# Patient Record
Sex: Male | Born: 1985 | Race: White | Hispanic: No | Marital: Single | Smoking: Current every day smoker
Health system: Southern US, Community
[De-identification: ages and names within clinical notes are randomized; demographics above are authoritative.]

## PROBLEM LIST (undated history)

## (undated) DIAGNOSIS — M5430 Sciatica, unspecified side: Secondary | ICD-10-CM

---
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
## 2008-02-10 ENCOUNTER — Emergency Department: Payer: Self-pay | Admitting: Emergency Medicine

## 2009-10-13 ENCOUNTER — Emergency Department: Payer: Self-pay | Admitting: Internal Medicine

## 2010-08-25 ENCOUNTER — Emergency Department: Payer: Self-pay | Admitting: Unknown Physician Specialty

## 2011-03-28 ENCOUNTER — Emergency Department: Payer: Self-pay | Admitting: Emergency Medicine

## 2011-07-07 ENCOUNTER — Emergency Department: Payer: Self-pay | Admitting: Emergency Medicine

## 2011-07-07 LAB — URINALYSIS, COMPLETE
Blood: NEGATIVE
Ketone: NEGATIVE
Nitrite: NEGATIVE
Ph: 7 (ref 4.5–8.0)
Protein: NEGATIVE
RBC,UR: NONE SEEN /HPF (ref 0–5)

## 2011-07-07 LAB — DRUG SCREEN, URINE
Amphetamines, Ur Screen: NEGATIVE (ref ?–1000)
Barbiturates, Ur Screen: NEGATIVE (ref ?–200)
Benzodiazepine, Ur Scrn: NEGATIVE (ref ?–200)
Cannabinoid 50 Ng, Ur ~~LOC~~: POSITIVE (ref ?–50)
Cocaine Metabolite,Ur ~~LOC~~: NEGATIVE (ref ?–300)
Opiate, Ur Screen: NEGATIVE (ref ?–300)
Phencyclidine (PCP) Ur S: NEGATIVE (ref ?–25)

## 2011-07-07 LAB — COMPREHENSIVE METABOLIC PANEL
Alkaline Phosphatase: 72 U/L (ref 50–136)
BUN: 10 mg/dL (ref 7–18)
Bilirubin,Total: 0.5 mg/dL (ref 0.2–1.0)
Co2: 28 mmol/L (ref 21–32)
Creatinine: 0.81 mg/dL (ref 0.60–1.30)
EGFR (African American): >60
Glucose: 93 mg/dL (ref 65–99)
Osmolality: 284 (ref 275–301)
SGPT (ALT): 27 U/L
Sodium: 143 mmol/L (ref 136–145)
Total Protein: 7.9 g/dL (ref 6.4–8.2)

## 2011-07-07 LAB — CBC
HCT: 47 % (ref 40.0–52.0)
MCV: 93 fL (ref 80–100)
RBC: 5.07 10*6/uL (ref 4.40–5.90)
WBC: 8.4 10*3/uL (ref 3.8–10.6)

## 2012-04-30 ENCOUNTER — Emergency Department: Payer: Self-pay | Admitting: Internal Medicine

## 2013-02-05 ENCOUNTER — Emergency Department: Payer: Self-pay | Admitting: Emergency Medicine

## 2013-02-05 LAB — CBC
HCT: 46.5 % (ref 40.0–52.0)
HGB: 16 g/dL (ref 13.0–18.0)
MCH: 31.4 pg (ref 26.0–34.0)
MCHC: 34.4 g/dL (ref 32.0–36.0)
MCV: 91 fL (ref 80–100)
RBC: 5.1 10*6/uL (ref 4.40–5.90)

## 2013-02-06 LAB — DRUG SCREEN, URINE
Amphetamines, Ur Screen: NEGATIVE (ref ?–1000)
Barbiturates, Ur Screen: NEGATIVE (ref ?–200)
Cannabinoid 50 Ng, Ur ~~LOC~~: POSITIVE (ref ?–50)
Cocaine Metabolite,Ur ~~LOC~~: NEGATIVE (ref ?–300)
Methadone, Ur Screen: NEGATIVE (ref ?–300)
Opiate, Ur Screen: NEGATIVE (ref ?–300)

## 2013-02-06 LAB — COMPREHENSIVE METABOLIC PANEL
Alkaline Phosphatase: 97 U/L (ref 50–136)
Anion Gap: 5 — ABNORMAL LOW (ref 7–16)
BUN: 7 mg/dL (ref 7–18)
Calcium, Total: 8.8 mg/dL (ref 8.5–10.1)
Chloride: 103 mmol/L (ref 98–107)
Co2: 29 mmol/L (ref 21–32)
Creatinine: 0.82 mg/dL (ref 0.60–1.30)
EGFR (African American): >60
EGFR (Non-African Amer.): >60
Osmolality: 272 (ref 275–301)
SGOT(AST): 20 U/L (ref 15–37)
SGPT (ALT): 29 U/L (ref 12–78)
Sodium: 137 mmol/L (ref 136–145)
Total Protein: 8 g/dL (ref 6.4–8.2)

## 2013-02-06 LAB — URINALYSIS, COMPLETE
Bilirubin,UR: NEGATIVE
Nitrite: NEGATIVE
Specific Gravity: 1.004 (ref 1.003–1.030)
Squamous Epithelial: NONE SEEN
WBC UR: NONE SEEN /HPF (ref 0–5)

## 2013-02-06 LAB — TSH: Thyroid Stimulating Horm: 1.01 u[IU]/mL

## 2013-02-06 LAB — ETHANOL
Ethanol %: 0.127 % — ABNORMAL HIGH (ref 0.000–0.080)
Ethanol: 127 mg/dL

## 2013-02-12 ENCOUNTER — Emergency Department: Payer: Self-pay | Admitting: Emergency Medicine

## 2013-02-12 LAB — URINALYSIS, COMPLETE
Glucose,UR: NEGATIVE mg/dL (ref 0–75)
Ketone: NEGATIVE
Leukocyte Esterase: NEGATIVE
Ph: 6 (ref 4.5–8.0)
Protein: NEGATIVE
RBC,UR: <1 /HPF (ref 0–5)
Specific Gravity: 1.005 (ref 1.003–1.030)
Squamous Epithelial: <1

## 2013-02-12 LAB — COMPREHENSIVE METABOLIC PANEL
Albumin: 4.1 g/dL (ref 3.4–5.0)
Anion Gap: 8 (ref 7–16)
Bilirubin,Total: 0.3 mg/dL (ref 0.2–1.0)
Calcium, Total: 8.4 mg/dL — ABNORMAL LOW (ref 8.5–10.1)
Chloride: 111 mmol/L — ABNORMAL HIGH (ref 98–107)
Co2: 24 mmol/L (ref 21–32)
Glucose: 99 mg/dL (ref 65–99)
Osmolality: 283 (ref 275–301)
SGOT(AST): 43 U/L — ABNORMAL HIGH (ref 15–37)
Sodium: 143 mmol/L (ref 136–145)
Total Protein: 7.6 g/dL (ref 6.4–8.2)

## 2013-02-12 LAB — CBC
MCHC: 34.8 g/dL (ref 32.0–36.0)
RDW: 12.8 % (ref 11.5–14.5)
WBC: 12.4 10*3/uL — ABNORMAL HIGH (ref 3.8–10.6)

## 2013-02-12 LAB — DRUG SCREEN, URINE
Amphetamines, Ur Screen: NEGATIVE (ref ?–1000)
Barbiturates, Ur Screen: NEGATIVE (ref ?–200)
Benzodiazepine, Ur Scrn: NEGATIVE (ref ?–200)
Cannabinoid 50 Ng, Ur ~~LOC~~: POSITIVE (ref ?–50)
Cocaine Metabolite,Ur ~~LOC~~: NEGATIVE (ref ?–300)
Phencyclidine (PCP) Ur S: NEGATIVE (ref ?–25)
Tricyclic, Ur Screen: NEGATIVE (ref ?–1000)

## 2013-02-12 LAB — ETHANOL
Ethanol %: 0.211 % — ABNORMAL HIGH (ref 0.000–0.080)
Ethanol: 211 mg/dL
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
## 2014-10-09 ENCOUNTER — Emergency Department: Admit: 2014-10-09 | Discharge status: Against Medical Advice | Payer: Self-pay | Admitting: Emergency Medicine

## 2017-11-13 ENCOUNTER — Emergency Department
Admission: EM | Admit: 2017-11-13 | Discharge: 2017-11-13 | Disposition: A | Discharge status: Home / Self Care | Payer: Self-pay | Attending: Emergency Medicine | Admitting: Emergency Medicine

## 2017-11-13 ENCOUNTER — Other Ambulatory Visit: Payer: Self-pay

## 2017-11-13 DIAGNOSIS — Y939 Activity, unspecified: Secondary | ICD-10-CM | POA: Insufficient documentation

## 2017-11-13 DIAGNOSIS — K047 Periapical abscess without sinus: Secondary | ICD-10-CM | POA: Insufficient documentation

## 2017-11-13 DIAGNOSIS — K0889 Other specified disorders of teeth and supporting structures: Secondary | ICD-10-CM

## 2017-11-13 DIAGNOSIS — S025XXB Fracture of tooth (traumatic), initial encounter for open fracture: Secondary | ICD-10-CM

## 2017-11-13 DIAGNOSIS — Y929 Unspecified place or not applicable: Secondary | ICD-10-CM | POA: Insufficient documentation

## 2017-11-13 DIAGNOSIS — Y998 Other external cause status: Secondary | ICD-10-CM | POA: Insufficient documentation

## 2017-11-13 DIAGNOSIS — F17200 Nicotine dependence, unspecified, uncomplicated: Secondary | ICD-10-CM | POA: Insufficient documentation

## 2017-11-13 DIAGNOSIS — Y33XXXA Other specified events, undetermined intent, initial encounter: Secondary | ICD-10-CM | POA: Insufficient documentation

## 2017-11-13 DIAGNOSIS — S025XXA Fracture of tooth (traumatic), initial encounter for closed fracture: Secondary | ICD-10-CM | POA: Insufficient documentation

## 2017-11-13 MED ORDER — AMOXICILLIN 500 MG PO CAPS: 500.0000 mg | ORAL_CAPSULE | Freq: Three times a day (TID) | ORAL | 0 refills | Status: DC

## 2017-11-13 MED ORDER — IBUPROFEN 800 MG PO TABS: 800.0000 mg | ORAL_TABLET | Freq: Three times a day (TID) | ORAL | 0 refills | Status: DC | PRN

## 2017-11-13 NOTE — ED Provider Notes (Signed)
[facility] Emergency Department Provider Note ____________________________________________  Time seen: 1145  I have reviewed the triage vital signs and the nursing notes.  HISTORY  Chief Complaint  Dental Pain   HPI Jeffery Morrow is a 32 y.o. male to the clinic today with left lower tooth pain and swelling.  He reports this started 2 to 3 days ago.  He reports a known broken tooth in that area.  He denies fever but has had chills.  He has rinsed his mouth with some salt water and took 2 doses of leftover amoxicillin with minimal relief.  He is not able to afford to see a dentist.  History reviewed. No pertinent past medical history.  There are no active problems to display for this patient.   History reviewed. No pertinent surgical history.  Prior to Admission medications   Medication Sig Start Date End Date Taking? Authorizing Provider  amoxicillin (AMOXIL) 500 MG capsule Take 1 capsule (500 mg total) by mouth 3 (three) times daily. 11/13/17   Lorre MunroeBaity, Cassady Stanczak W, NP  ibuprofen (ADVIL,MOTRIN) 800 MG tablet Take 1 tablet (800 mg total) by mouth every 8 (eight) hours as needed. 11/13/17   Lorre MunroeBaity, Dove Gresham W, NP    Allergies Patient has no known allergies.  No family history on file.  Social History Social History   Tobacco Use  . Smoking status: Current Every Day Smoker  . Smokeless tobacco: Never Used  Substance Use Topics  . Alcohol use: Yes  . Drug use: Not Currently    Review of Systems  Constitutional: Negative for fever. ENT: Positive for tooth pain and gum swelling.. Cardiovascular: Negative for chest pain. Respiratory: Negative for shortness of breath. ____________________________________________  PHYSICAL EXAM:  VITAL SIGNS: ED Triage Vitals  Enc Vitals Group     BP 11/13/17 1057 114/70     Pulse Rate 11/13/17 1057 75     Resp --      Temp 11/13/17 1057 98.4 F (36.9 C)     Temp Source 11/13/17 1057 Oral     SpO2 11/13/17 1057 100  %     Weight 11/13/17 1057 160 lb (72.6 kg)     Height 11/13/17 1057 5\' 11"  (1.803 m)     Head Circumference --      Peak Flow --      Pain Score 11/13/17 1113 7     Pain Loc --      Pain Edu? --      Excl. in GC? --     Constitutional: Alert and oriented. Well appearing and in no distress. Mouth/Throat: Mucous membranes are pink and moist.  Broken tooth noted of the left lower jaw.  Associated gum swelling noted. Hematological/Lymphatic/Immunological: No cervical lymphadenopathy. Cardiovascular: Normal rate, regular rhythm.  Respiratory: Normal respiratory effort. No wheezes/rales/rhonchi.  INITIAL IMPRESSION / ASSESSMENT AND PLAN / ED COURSE  Dental Pain secondary to Broken Tooth and Abscess:  Rx provided for Amoxicillin 500 mg 3 times daily x10 days Rx for ibuprofen 800 mg 3 times a day as needed for pain and inflammation Encouraged salt water gargles Advised him to [facility]    FINAL CLINICAL IMPRESSION(S) / ED DIAGNOSES  Final diagnoses:  Pain, dental  Open fracture of tooth, initial encounter  Dental abscess      Lorre MunroeBaity, Mckenzie Toruno W, NP 11/13/17 1211    Dionne BucySiadecki, Sebastian, MD 11/13/17 1338

## 2017-11-13 NOTE — Discharge Instructions (Signed)
We are treating you for a dental abscess.  You have been provided a prescription for amoxicillin and ibuprofen.  Continue salt water gargles.  [facility].

## 2017-11-13 NOTE — ED Triage Notes (Signed)
Pt c/o left lower tooth ache for the past 2-3 days with some mild swelling

## 2018-05-28 ENCOUNTER — Emergency Department
Admission: EM | Admit: 2018-05-28 | Discharge: 2018-05-28 | Disposition: A | Discharge status: Home / Self Care | Payer: Self-pay | Attending: Emergency Medicine | Admitting: Emergency Medicine

## 2018-05-28 ENCOUNTER — Other Ambulatory Visit: Payer: Self-pay

## 2018-05-28 DIAGNOSIS — K047 Periapical abscess without sinus: Secondary | ICD-10-CM | POA: Insufficient documentation

## 2018-05-28 DIAGNOSIS — F419 Anxiety disorder, unspecified: Secondary | ICD-10-CM | POA: Insufficient documentation

## 2018-05-28 DIAGNOSIS — F17210 Nicotine dependence, cigarettes, uncomplicated: Secondary | ICD-10-CM | POA: Insufficient documentation

## 2018-05-28 DIAGNOSIS — Z79899 Other long term (current) drug therapy: Secondary | ICD-10-CM | POA: Insufficient documentation

## 2018-05-28 MED ORDER — HYDROXYZINE HCL 10 MG PO TABS: 10.0000 mg | ORAL_TABLET | Freq: Three times a day (TID) | ORAL | 0 refills | Status: DC | PRN

## 2018-05-28 MED ORDER — MAGIC MOUTHWASH W/LIDOCAINE: 5.0000 mL | Freq: Four times a day (QID) | ORAL | 0 refills | Status: DC

## 2018-05-28 MED ORDER — CLINDAMYCIN HCL 300 MG PO CAPS: 300.0000 mg | ORAL_CAPSULE | Freq: Four times a day (QID) | ORAL | 0 refills | Status: DC

## 2018-05-28 MED ORDER — CHLORHEXIDINE GLUCONATE 0.12 % MT SOLN: 10.0000 mL | Freq: Two times a day (BID) | OROMUCOSAL | 0 refills | Status: DC
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
## 2018-05-28 NOTE — ED Notes (Signed)
See triage note. Pt states he has recently "taken left over antibiotics for a tooth ache but it didn't take the pain away." Pt c/o s/s of panic attack. Pt currently calm and cooperative.

## 2018-05-28 NOTE — ED Triage Notes (Signed)
Pt c/o "anxiety attacks", headache, neck pain, decreased appetite

## 2019-08-06 ENCOUNTER — Emergency Department
Admission: EM | Admit: 2019-08-06 | Discharge: 2019-08-06 | Disposition: A | Discharge status: Home / Self Care | Payer: Self-pay | Attending: Emergency Medicine | Admitting: Emergency Medicine

## 2019-08-06 ENCOUNTER — Encounter: Payer: Self-pay | Admitting: Emergency Medicine

## 2019-08-06 ENCOUNTER — Other Ambulatory Visit: Payer: Self-pay

## 2019-08-06 DIAGNOSIS — F17210 Nicotine dependence, cigarettes, uncomplicated: Secondary | ICD-10-CM | POA: Insufficient documentation

## 2019-08-06 DIAGNOSIS — K047 Periapical abscess without sinus: Secondary | ICD-10-CM | POA: Insufficient documentation

## 2019-08-06 MED ORDER — TRAMADOL HCL 50 MG PO TABS: 50.0000 mg | ORAL_TABLET | Freq: Four times a day (QID) | ORAL | 0 refills | Status: DC | PRN

## 2019-08-06 MED ORDER — CHLORHEXIDINE GLUCONATE 0.12 % MT SOLN: 10.0000 mL | Freq: Two times a day (BID) | OROMUCOSAL | 0 refills | Status: DC

## 2019-08-06 MED ORDER — AMOXICILLIN 500 MG PO CAPS: 500.0000 mg | ORAL_CAPSULE | Freq: Three times a day (TID) | ORAL | 0 refills | Status: DC

## 2019-08-06 NOTE — ED Triage Notes (Signed)
Pt reports toothache to left lower jaw for last week and a half.

## 2019-08-06 NOTE — ED Provider Notes (Signed)
[facility] Emergency Department Provider Note ____________________________________________  Time seen: Approximately 5:19 PM  I have reviewed the triage vital signs and the nursing notes.   HISTORY  Chief Complaint Dental Pain   HPI Jeffery Morrow is a 34 y.o. male presenting to the emergency department for treatment and evaluation of dental pain.  Patient states pain started about 10 days ago.  He has had problems with this tooth in the past but has not been able to afford to have it removed.  He states that several years ago he had a root canal on the same tooth but has lost the Somewhere along the way.  Pain has become intense and he is now noticing some facial swelling on the left lower jaw area.  No fever or other concerns.   History reviewed. No pertinent past medical history.  There are no problems to display for this patient.   History reviewed. No pertinent surgical history.  Prior to Admission medications   Medication Sig Start Date End Date Taking? Authorizing Provider  amoxicillin (AMOXIL) 500 MG capsule Take 1 capsule (500 mg total) by mouth 3 (three) times daily. 08/06/19   Shamir Sedlar, Rulon Eisenmenger B, FNP  chlorhexidine (PERIDEX) 0.12 % solution Use as directed 10 mLs in the mouth or throat 2 (two) times daily. Swish and spit 08/06/19   Quamesha Mullet B, FNP  hydrOXYzine (ATARAX/VISTARIL) 10 MG tablet Take 1 tablet (10 mg total) by mouth 3 (three) times daily as needed for anxiety. 05/28/18   Cuthriell, Delorise Royals, PA-C  ibuprofen (ADVIL,MOTRIN) 800 MG tablet Take 1 tablet (800 mg total) by mouth every 8 (eight) hours as needed. 11/13/17   Lorre Munroe, NP  magic mouthwash w/lidocaine SOLN Take 5 mLs by mouth 4 (four) times daily. Swish and spit 05/28/18   Cuthriell, Delorise Royals, PA-C  traMADol (ULTRAM) 50 MG tablet Take 1 tablet (50 mg total) by mouth every 6 (six) hours as needed. 08/06/19   Chinita Pester, FNP    Allergies Patient has no known  allergies.  No family history on file.  Social History Social History   Tobacco Use  . Smoking status: Current Every Day Smoker    Packs/day: 1.00    Types: Cigarettes  . Smokeless tobacco: Never Used  Substance Use Topics  . Alcohol use: Yes    Comment: socially  . Drug use: Not Currently    Review of Systems Constitutional: Negative for fever or recent illness. ENT: Positive for dental pain. Musculoskeletal: Negative for trismus of the jaw.  Skin: Negative for wound or lesion. ____________________________________________   PHYSICAL EXAM:  VITAL SIGNS: ED Triage Vitals  Enc Vitals Group     BP 08/06/19 1239 136/86     Pulse Rate 08/06/19 1239 90     Resp 08/06/19 1239 18     Temp 08/06/19 1239 99 F (37.2 C)     Temp Source 08/06/19 1239 Oral     SpO2 08/06/19 1239 100 %     Weight 08/06/19 1227 180 lb (81.6 kg)     Height 08/06/19 1227 5\' 11"  (1.803 m)     Head Circumference --      Peak Flow --      Pain Score 08/06/19 1227 8     Pain Loc --      Pain Edu? --      Excl. in GC? --     Constitutional: Alert and oriented. Well appearing and in no acute distress. Eyes:  Conjunctiva are clear without discharge or drainage. Mouth/Throat: airway patent. No edema of the tongue Periodontal Exam    Hematological/Lymphatic/Immunilogical: No palpable adenopathy Respiratory: Respirations even and unlabored. Musculoskeletal: Full ROM of the jaw. Neurologic: Awake, alert, oriented.  Skin:  Mild swelling over the left lower jaw that does not extend into the sublingual surface or submental area. Psychiatric: Affect and behavior intact.  ____________________________________________   LABS (all labs ordered are listed, but only abnormal results are displayed)  Labs Reviewed - No data to display ____________________________________________   RADIOLOGY  Not indicated. ____________________________________________   PROCEDURES  Procedure(s) performed:    Procedures  Critical Care performed: No ____________________________________________   INITIAL IMPRESSION / ASSESSMENT AND PLAN / ED COURSE  Jeffery Morrow is a 34 y.o. male presenting to the emergency department for treatment and evaluation of dental pain. See HPI for details. He will be treated with amoxicillin, tramadol, and peridex. He is to see the dentist within the next 2 weeks if possible. He is to return to the ER for symptoms of concern if unable to schedule an appointment.  Pertinent labs & imaging results that were available during my care of the patient were reviewed by me and considered in my medical decision making (see chart for details).  ____________________________________________   FINAL CLINICAL IMPRESSION(S) / ED DIAGNOSES  Final diagnoses:  Dental abscess    Discharge Medication List as of 08/06/2019  1:01 PM    START taking these medications   Details  traMADol (ULTRAM) 50 MG tablet Take 1 tablet (50 mg total) by mouth every 6 (six) hours as needed., Starting Mon 08/06/2019, Normal        If controlled substance prescribed during this visit, 12 month history viewed on the [place] prior to issuing an initial prescription for Schedule II or III opiod.  Note:  This document was prepared using Dragon voice recognition software and may include unintentional dictation errors.   Victorino Dike, FNP 08/06/19 1741    Duffy Bruce, MD 08/07/19 1429

## 2019-08-06 NOTE — ED Notes (Signed)
See triage note. Pt in with severe pain to tooth on lower left side. Abscess present. Provider Triplett currently educating and discussing plan of care with patient.

## 2020-03-04 ENCOUNTER — Other Ambulatory Visit: Payer: Self-pay

## 2021-06-19 ENCOUNTER — Emergency Department: Payer: Self-pay

## 2021-06-19 ENCOUNTER — Other Ambulatory Visit: Payer: Self-pay

## 2021-06-19 ENCOUNTER — Encounter: Payer: Self-pay | Admitting: Emergency Medicine

## 2021-06-19 DIAGNOSIS — S61402A Unspecified open wound of left hand, initial encounter: Secondary | ICD-10-CM | POA: Insufficient documentation

## 2021-06-19 DIAGNOSIS — Y990 Civilian activity done for income or pay: Secondary | ICD-10-CM | POA: Insufficient documentation

## 2021-06-19 DIAGNOSIS — X58XXXA Exposure to other specified factors, initial encounter: Secondary | ICD-10-CM | POA: Insufficient documentation

## 2021-06-19 DIAGNOSIS — Z23 Encounter for immunization: Secondary | ICD-10-CM | POA: Insufficient documentation

## 2021-06-19 LAB — CBC WITH DIFFERENTIAL/PLATELET
Abs Immature Granulocytes: 0.03 10*3/uL (ref 0.00–0.07)
Basophils Absolute: 0 10*3/uL (ref 0.0–0.1)
Basophils Relative: 0 %
Eosinophils Absolute: 0.3 10*3/uL (ref 0.0–0.5)
Eosinophils Relative: 3 %
HCT: 47.7 % (ref 39.0–52.0)
Hemoglobin: 16.3 g/dL (ref 13.0–17.0)
Immature Granulocytes: 0 %
Lymphocytes Relative: 31 %
Lymphs Abs: 2.9 10*3/uL (ref 0.7–4.0)
MCH: 32.6 pg (ref 26.0–34.0)
MCHC: 34.2 g/dL (ref 30.0–36.0)
MCV: 95.4 fL (ref 80.0–100.0)
Monocytes Absolute: 1 10*3/uL (ref 0.1–1.0)
Monocytes Relative: 10 %
Neutro Abs: 5.1 10*3/uL (ref 1.7–7.7)
Neutrophils Relative %: 56 %
Platelets: 246 10*3/uL (ref 150–400)
RBC: 5 MIL/uL (ref 4.22–5.81)
RDW: 13 % (ref 11.5–15.5)
WBC: 9.3 10*3/uL (ref 4.0–10.5)
nRBC: 0 % (ref 0.0–0.2)

## 2021-06-19 LAB — COMPREHENSIVE METABOLIC PANEL
ALT: 40 U/L (ref 0–44)
AST: 38 U/L (ref 15–41)
Albumin: 3.8 g/dL (ref 3.5–5.0)
Alkaline Phosphatase: 93 U/L (ref 38–126)
Anion gap: 7 (ref 5–15)
BUN: 7 mg/dL (ref 6–20)
CO2: 28 mmol/L (ref 22–32)
Calcium: 9 mg/dL (ref 8.9–10.3)
Chloride: 103 mmol/L (ref 98–111)
Creatinine, Ser: 1.02 mg/dL (ref 0.61–1.24)
GFR, Estimated: >60 mL/min (ref >60)
Glucose, Bld: 101 mg/dL — ABNORMAL HIGH (ref 70–99)
Potassium: 3.4 mmol/L — ABNORMAL LOW (ref 3.5–5.1)
Sodium: 138 mmol/L (ref 135–145)
Total Bilirubin: 0.4 mg/dL (ref 0.3–1.2)
Total Protein: 7.3 g/dL (ref 6.5–8.1)

## 2021-06-19 LAB — LACTIC ACID, PLASMA: Lactic Acid, Venous: 1.4 mmol/L (ref 0.5–1.9)

## 2021-06-19 NOTE — ED Notes (Addendum)
Tubes sent to lab: blood culture x 2, 1 red/blue/light green/green/lav, 1 lactic on ice

## 2021-06-19 NOTE — ED Triage Notes (Signed)
Pt c/o left hand pain after getting a wound on his left middle finger Monday or Tuesday. Pt has red streaking up left hand and hand is swollen.

## 2021-06-20 ENCOUNTER — Emergency Department
Admission: EM | Admit: 2021-06-20 | Discharge: 2021-06-20 | Disposition: A | Discharge status: Home / Self Care | Payer: Self-pay | Attending: Emergency Medicine | Admitting: Emergency Medicine

## 2021-06-20 DIAGNOSIS — S61402A Unspecified open wound of left hand, initial encounter: Secondary | ICD-10-CM

## 2021-06-20 MED ORDER — PROBIOTIC 1-250 BILLION-MG PO CAPS: 1.0000 | ORAL_CAPSULE | Freq: Two times a day (BID) | ORAL | 0 refills | Status: DC

## 2021-06-20 MED ORDER — CEPHALEXIN 500 MG PO CAPS: 500.0000 mg | ORAL_CAPSULE | Freq: Four times a day (QID) | ORAL | 0 refills | Status: AC

## 2021-06-20 MED ORDER — SULFAMETHOXAZOLE-TRIMETHOPRIM 800-160 MG PO TABS: 1.0000 | ORAL_TABLET | Freq: Two times a day (BID) | ORAL | 0 refills | Status: DC

## 2021-06-20 MED ORDER — CEPHALEXIN 500 MG PO CAPS
500.0000 mg | ORAL_CAPSULE | Freq: Once | ORAL | Status: AC
  Administered 2021-06-20: 500 mg via ORAL
  Filled 2021-06-20: qty 1

## 2021-06-20 MED ORDER — TETANUS-DIPHTH-ACELL PERTUSSIS 5-2.5-18.5 LF-MCG/0.5 IM SUSY
0.5000 mL | PREFILLED_SYRINGE | Freq: Once | INTRAMUSCULAR | Status: AC
  Administered 2021-06-20: 0.5 mL via INTRAMUSCULAR
  Filled 2021-06-20: qty 0.5

## 2021-06-20 MED ORDER — SULFAMETHOXAZOLE-TRIMETHOPRIM 800-160 MG PO TABS
1.0000 | ORAL_TABLET | Freq: Once | ORAL | Status: AC
  Administered 2021-06-20: 1 via ORAL
  Filled 2021-06-20: qty 1

## 2021-06-20 NOTE — ED Provider Notes (Signed)
[facility] Provider Note    Event Date/Time   First MD Initiated Contact with Patient 06/20/21 0421     (approximate)   History   Cellulitis   HPI  Jeffery Morrow is a 36 y.o. 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  He states that the area has become slightly more swollen and red.  He has not had any drainage.  No fevers.  He is not immunocompromised or diabetic.  No pain, tenderness over the flexor tendon and no difficulty flexing this finger.  No numbness or weakness.  Unsure of his last tetanus vaccine.   History provided by patient and significant other at bedside.      History reviewed. No pertinent past medical history.  History reviewed. No pertinent surgical history.  MEDICATIONS:  Prior to Admission medications   Medication Sig Start Date End Date Taking? Authorizing Provider  amoxicillin (AMOXIL) 500 MG capsule Take 1 capsule (500 mg total) by mouth 3 (three) times daily. 08/06/19   Triplett, Rulon Eisenmengerari B, FNP  chlorhexidine (PERIDEX) 0.12 % solution Use as directed 10 mLs in the mouth or throat 2 (two) times daily. Swish and spit 08/06/19   Triplett, Cari B, FNP  hydrOXYzine (ATARAX/VISTARIL) 10 MG tablet Take 1 tablet (10 mg total) by mouth 3 (three) times daily as needed for anxiety. 05/28/18   Cuthriell, Delorise RoyalsJonathan D, PA-C  ibuprofen (ADVIL,MOTRIN) 800 MG tablet Take 1 tablet (800 mg total) by mouth every 8 (eight) hours as needed. 11/13/17   Lorre MunroeBaity, Regina W, NP  magic mouthwash w/lidocaine SOLN Take 5 mLs by mouth 4 (four) times daily. Swish and spit 05/28/18   Cuthriell, Delorise RoyalsJonathan D, PA-C  traMADol (ULTRAM) 50 MG tablet Take 1 tablet (50 mg total) by mouth every 6 (six) hours as needed. 08/06/19   Chinita Pesterriplett, Cari B, FNP    Physical Exam   Triage Vital  Signs: ED Triage Vitals [06/19/21 2024]  Enc Vitals Group     BP 124/75     Pulse Rate 73     Resp 18     Temp 98.3 F (36.8 C)     Temp src      SpO2 99 %     Weight 170 lb (77.1 kg)     Height 5\' 11"  (1.803 m)     Head Circumference      Peak Flow      Pain Score 6     Pain Loc      Pain Edu?      Excl. in GC?     Most recent vital signs: Vitals:   06/19/21 2024 06/20/21 0437  BP: 124/75 128/81  Pulse: 73 75  Resp: 18 20  Temp: 98.3 F (36.8 C) 98 F (36.7 C)  SpO2: 99% 100%    CONSTITUTIONAL: Alert and oriented and responds appropriately to questions. Well-appearing; well-nourished HEAD: Normocephalic, atraumatic EYES: Conjunctivae clear, pupils appear equal, sclera nonicteric ENT: normal nose; moist mucous membranes NECK: Supple, normal ROM CARD: RRR; S1 and S2 appreciated; no murmurs, no clicks, no rubs, no gallops RESP: Normal chest excursion without splinting or tachypnea; breath sounds clear and equal bilaterally; no wheezes, no rhonchi, no rales, no hypoxia or respiratory distress, speaking full sentences ABD/GI: Normal bowel sounds; non-distended; soft, non-tender, no rebound,  no guarding, no peritoneal signs BACK: The back appears normal EXT: Patient has a superficial 1 cm x 6 mm wound to the proximal radial aspect of the left third digit with small amount of surrounding redness and warmth that does extend to the dorsal hand but not up into the arm.  There is no fluctuance, drainage.  No induration.  There is no redness, warmth, tenderness over the flexor tendon of this finger.  He has full flexion and extension of the third digit.  Normal capillary refill.  2+ left radial pulse.  Compartments of the left arm are soft.  No bony deformity or bony injury.  No joint effusions noted. SKIN: Normal color for age and race; warm; no rash on exposed skin NEURO: Moves all extremities equally, normal speech PSYCH: The patient's mood and manner are  appropriate.     LEFT HAND  Patient gave verbal permission to utilize photo for medical documentation only. The image was not stored on any personal device.    ED Results / Procedures / Treatments   LABS: (all labs ordered are listed, but only abnormal results are displayed) Labs Reviewed  COMPREHENSIVE METABOLIC PANEL - Abnormal; Notable for the following components:      Result Value   Potassium 3.4 (*)    Glucose, Bld 101 (*)    All other components within normal limits  CULTURE, BLOOD (ROUTINE X 2)  CULTURE, BLOOD (ROUTINE X 2)  CBC WITH DIFFERENTIAL/PLATELET  LACTIC ACID, PLASMA  LACTIC ACID, PLASMA     EKG:     RADIOLOGY: My personal review and interpretation of imaging: X-ray of the left hand shows no fracture, foreign body, osteomyelitis.  I have personally reviewed all radiology reports.   DG Hand Complete Left  Result Date: 06/19/2021 CLINICAL DATA:  Middle left finger wound with worsening left hand swelling. EXAM: LEFT HAND - COMPLETE 3+ VIEW COMPARISON:  None. FINDINGS: There is no evidence of fracture or dislocation. There is no evidence of arthropathy or other focal bone abnormality. A superficial soft tissue defect is seen adjacent to the distal aspect of the proximal phalanx of the third left finger. IMPRESSION: Soft tissue defect involving the third left finger, without evidence of acute osseous abnormality. Electronically Signed   By: Aram Candela M.D.   On: 06/19/2021 20:55     PROCEDURES:  Critical Care performed: No     Procedures    IMPRESSION / MDM / ASSESSMENT AND PLAN / ED COURSE  I reviewed the triage vital signs and the nursing notes.    Patient here with a superficial wound to the left third digit with surrounding cellulitis.    DIFFERENTIAL DIAGNOSIS (includes but not limited to):   Cellulitis, abscess, less likely flexor tenosynovitis, osteomyelitis, bacteremia, sepsis   PLAN: Labs, x-ray, antibiotics, tetanus  vaccine.   MEDICATIONS GIVEN IN ED: Medications  sulfamethoxazole-trimethoprim (BACTRIM DS) 800-160 MG per tablet 1 tablet (1 tablet Oral Given 06/20/21 0445)  cephALEXin (KEFLEX) capsule 500 mg (500 mg Oral Given 06/20/21 0445)  Tdap (BOOSTRIX) injection 0.5 mL (0.5 mLs Intramuscular Given 06/20/21 0445)     ED COURSE: Patient's labs today were reviewed and were reassuring.  Normal white blood cell count.  Normal lactic.  X-ray reviewed by myself and radiologist shows no retained foreign body, fracture or signs of osteomyelitis.  He is well-appearing, nontoxic here and is not diabetic or immunocompromise.  On exam he has no sign of herpetic whitlow, felon, abscess, flexor tenosynovitis.  He is neurovascular  intact distally.  He has not yet been on any antibiotics for this.  We will clean the wound and bandage.  We will place him on Keflex and Bactrim for broad coverage.  Recommended probiotics while on these medications.  Recommended Tylenol, Motrin for pain control.  His tetanus vaccine has been updated today.  Discussed wound care instructions and return precautions with patient and girlfriend at bedside.   At this time, I do not feel there is any life-threatening condition present. I reviewed all nursing notes, vitals, pertinent previous records.  All labs, EKGs, imaging ordered have been independently reviewed and interpreted by myself.  I have reviewed nursing notes and appropriate previous records.  I feel the patient is safe to be discharged home without further emergent workup and can continue workup as an outpatient as needed. Discussed all findings and treatment plan with patient and girlfriend.  As well as usual and customary return precautions.  They verbalize understanding and are comfortable with this plan.  Outpatient follow-up has been provided as needed. All questions have been answered.    CONSULTS: Admission not required at this time given cellulitis is mild without systemic symptoms  with reassuring labs, vitals.  Does not need emergent hand surgery evaluation as he has no sign of flexor tenosynovitis, abscess on exam.   OUTSIDE RECORDS REVIEWED: Patient has multiple previous notes from [facility] emergency department but no other clinic notes in epic or care everywhere for review.         FINAL CLINICAL IMPRESSION(S) / ED DIAGNOSES   Final diagnoses:  Open wound of left hand without foreign body, unspecified wound type, initial encounter     Rx / DC Orders   ED Discharge Orders          Ordered    cephALEXin (KEFLEX) 500 MG capsule  4 times daily        06/20/21 0435    sulfamethoxazole-trimethoprim (BACTRIM DS) 800-160 MG tablet  2 times daily        06/20/21 0435    Bacillus Coagulans-Inulin (PROBIOTIC) 1-250 BILLION-MG CAPS  2 times daily        06/20/21 0435             Note:  This document was prepared using Dragon voice recognition software and may include unintentional dictation errors.   Pailyn Bellevue, Layla Maw, DO 06/20/21 [phone]

## 2021-06-20 NOTE — Discharge Instructions (Addendum)
You may alternate Tylenol 1000 mg every 6 hours as needed for pain, fever and Ibuprofen 800 mg every 8 hours as needed for pain, fever.  Please take Ibuprofen with food.  Do not take more than 4000 mg of Tylenol (acetaminophen) in a 24 hour period.   Steps to find a Primary Care Provider (PCP):  Call [phone] or [phone] to access "[place] Find a Doctor Service."  2.  You may also go on the [place] website at [email or web address]  

## 2021-06-24 LAB — CULTURE, BLOOD (ROUTINE X 2)
Culture: NO GROWTH
Culture: NO GROWTH
Special Requests: ADEQUATE
Special Requests: ADEQUATE

## 2022-01-09 ENCOUNTER — Emergency Department
Admission: EM | Admit: 2022-01-09 | Discharge: 2022-01-09 | Disposition: A | Discharge status: Home / Self Care | Payer: Self-pay | Attending: Student in an Organized Health Care Education/Training Program | Admitting: Student in an Organized Health Care Education/Training Program

## 2022-01-09 ENCOUNTER — Other Ambulatory Visit: Payer: Self-pay

## 2022-01-09 ENCOUNTER — Encounter: Payer: Self-pay | Admitting: Emergency Medicine

## 2022-01-09 DIAGNOSIS — K047 Periapical abscess without sinus: Secondary | ICD-10-CM | POA: Insufficient documentation

## 2022-01-09 DIAGNOSIS — K0381 Cracked tooth: Secondary | ICD-10-CM | POA: Insufficient documentation

## 2022-01-09 MED ORDER — AMOXICILLIN 500 MG PO CAPS: 500.0000 mg | ORAL_CAPSULE | Freq: Three times a day (TID) | ORAL | 0 refills | Status: DC

## 2022-01-09 MED ORDER — NAPROXEN 500 MG PO TABS: 500.0000 mg | ORAL_TABLET | Freq: Two times a day (BID) | ORAL | 0 refills | Status: DC

## 2022-01-09 MED ORDER — TRAMADOL HCL 50 MG PO TABS: 50.0000 mg | ORAL_TABLET | Freq: Four times a day (QID) | ORAL | 0 refills | Status: DC | PRN

## 2022-01-09 NOTE — ED Provider Notes (Signed)
[facility] Provider Note    Event Date/Time   First MD Initiated Contact with Patient 01/09/22 [phone]     (approximate)   History   Dental Pain   HPI  Jeffery Morrow is a 36 y.o. male  presents to the ER for treatment and evaluation of right side facial pain and swelling.  He has had a broken tooth for the past couple years that has been painful off and on but never to this point.  Pain started increasing yesterday and he awakened today with facial swelling.  No fever.   History reviewed. No pertinent past medical history.   Physical Exam   Triage Vital Signs: ED Triage Vitals  Enc Vitals Group     BP 01/09/22 0759 128/87     Pulse Rate 01/09/22 0759 62     Resp 01/09/22 0759 20     Temp 01/09/22 0759 97.9 F (36.6 C)     Temp Source 01/09/22 0759 Oral     SpO2 01/09/22 0759 99 %     Weight 01/09/22 0758 170 lb (77.1 kg)     Height 01/09/22 0758 6' (1.829 m)     Head Circumference --      Peak Flow --      Pain Score 01/09/22 0758 10     Pain Loc --      Pain Edu? --      Excl. in GC? --     Most recent vital signs: Vitals:   01/09/22 0759  BP: 128/87  Pulse: 62  Resp: 20  Temp: 97.9 F (36.6 C)  SpO2: 99%    General: Awake, no distress.  CV:  Good peripheral perfusion.  Resp:  Normal effort.  Abd:  No distention.  Other:  Right side facial swelling with associated chronic dental fracture. No obvious gingival fluctuance.  Gingival erythema and edema surrounding broken tooth.  Sublingual surface is soft.  No cervical lymphadenopathy   ED Results / Procedures / Treatments   Labs (all labs ordered are listed, but only abnormal results are displayed) Labs Reviewed - No data to display   EKG     RADIOLOGY    I have independently reviewed and interpreted imaging as well as reviewed report from radiology.  PROCEDURES:  Critical Care performed: No  Procedures   MEDICATIONS ORDERED IN ED:  Medications - No data  to display   IMPRESSION / MDM / ASSESSMENT AND PLAN / ED COURSE   I reviewed the triage vital signs and the nursing notes.  Differential diagnosis includes, but is not limited to: Dental pain, dental abscess, necrotizing gingivitis  Patient's presentation is most consistent with acute, uncomplicated illness.  36 year old male presenting to the emergency department for treatment and evaluation of dental pain with associated facial swelling.  See HPI for further details.  Patient will be placed on amoxicillin and given a short course of tramadol as he does appear that he is not significant pain.  He was encouraged to see a dentist within 2 weeks.  If symptoms change or worsen and he is unable to get an appointment, he is to return to the emergency department.     FINAL CLINICAL IMPRESSION(S) / ED DIAGNOSES   Final diagnoses:  Dental abscess     Rx / DC Orders   ED Discharge Orders          Ordered    amoxicillin (AMOXIL) 500 MG capsule  3 times daily  01/09/22 0814    traMADol (ULTRAM) 50 MG tablet  Every 6 hours PRN        01/09/22 0814    naproxen (NAPROSYN) 500 MG tablet  2 times daily with meals        01/09/22 0354             Note:  This document was prepared using Dragon voice recognition software and may include unintentional dictation errors.   Chinita Pester, FNP 01/09/22 [id number]    Willy Eddy, MD 01/09/22 [phone]

## 2022-01-09 NOTE — ED Notes (Signed)
Pt report broke a tooth to his right upper jaw 2 years ago and yesterday he started with pain to that tooth in his right upper jaw. Pt reports pain radiated all over the left side of his face. Pt reports does not have a regular dentist. Denies fevers

## 2022-01-09 NOTE — ED Notes (Signed)
Pt verbalizes understanding of d/c instructions, medications and follow up 

## 2022-01-09 NOTE — ED Triage Notes (Signed)
Pt via POV from home. Pt c/o R upper dental pain stated yesterday around 0400 AM states that the pain is causing some R sided facial swelling. Some swelling noticed. Pt states he has a broken tooth on that side. Pt is A&OX4 and NAD

## 2022-01-09 NOTE — Discharge Instructions (Signed)
Please call and schedule a dental appointment as soon as possible. You will need to be seen within the next 14 days. Return to the emergency department for symptoms that change or worsen if you're unable to schedule an appointment. ° °

## 2023-06-17 IMAGING — CR DG HAND COMPLETE 3+V*L*
3 series · 3 of 3 positions shown · non-contrast
Comparison: None.

CLINICAL DATA: Middle left finger wound with worsening left hand
swelling.

EXAM:
LEFT HAND - COMPLETE 3+ VIEW

[hand ap]
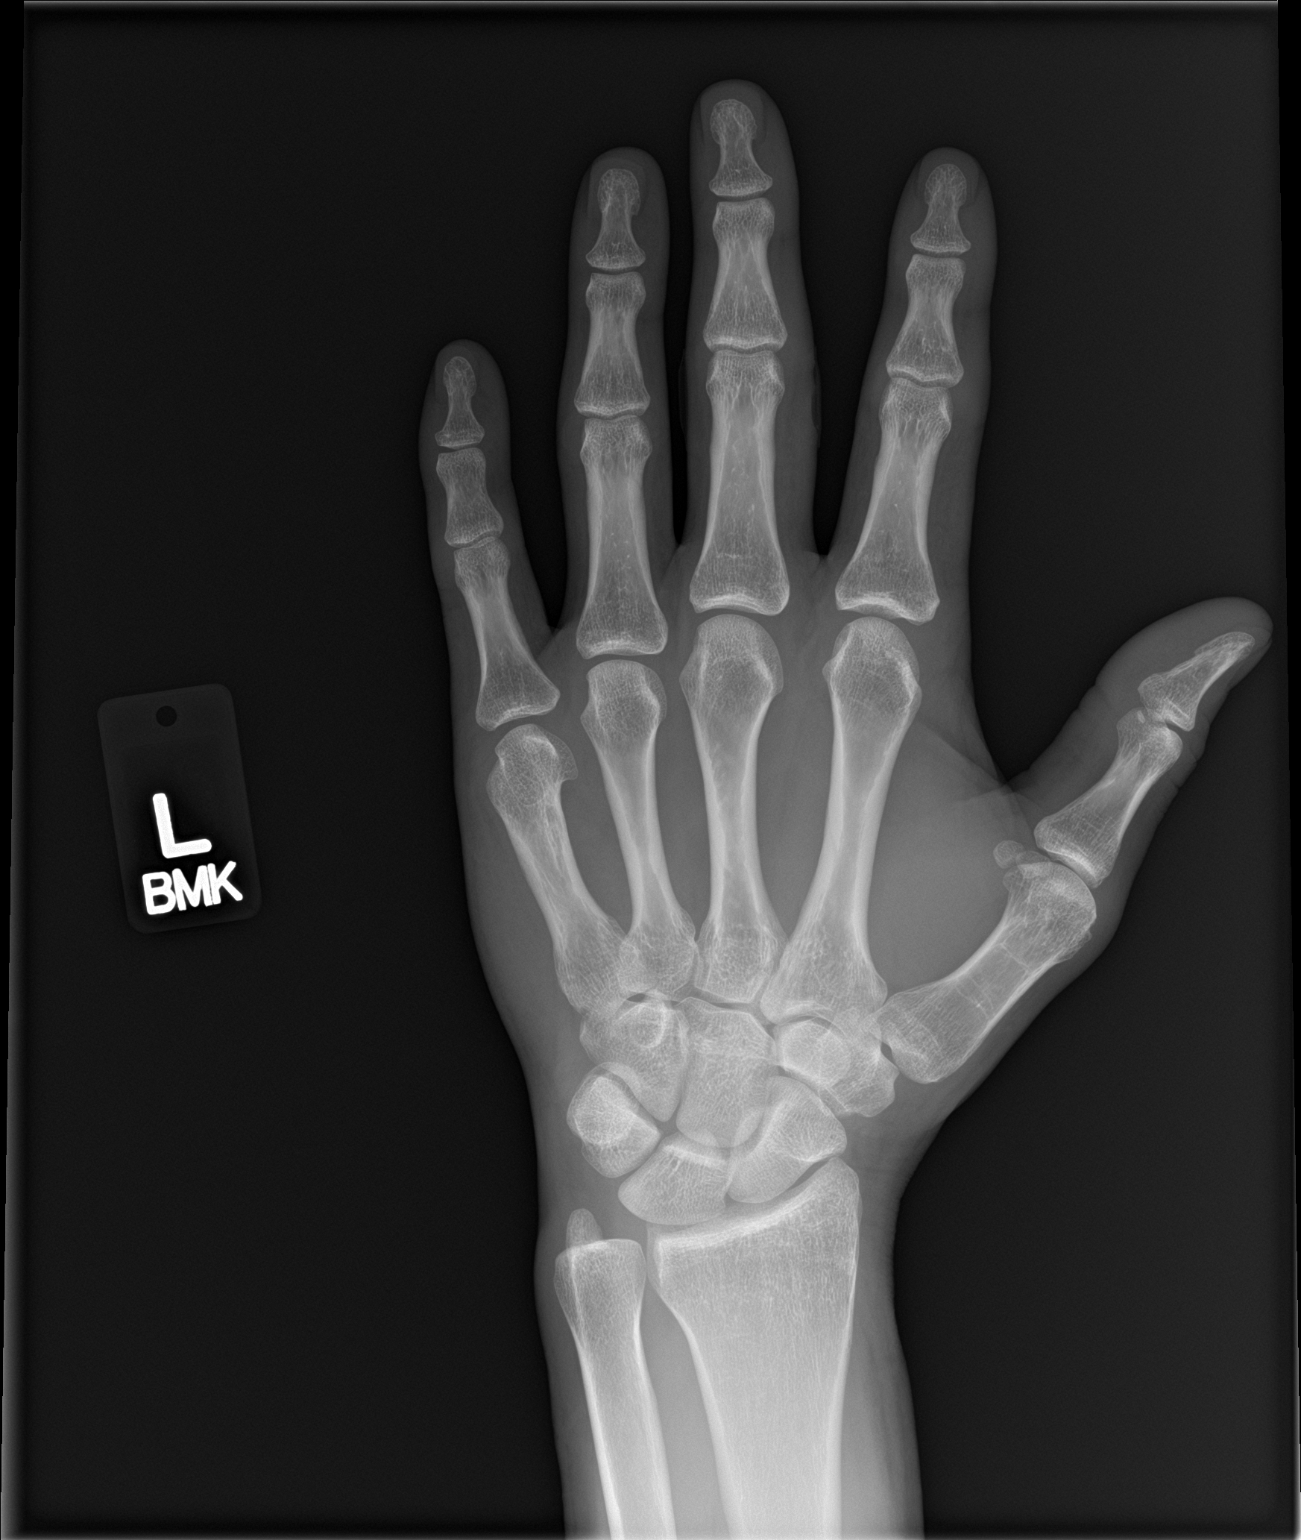

[hand obl]
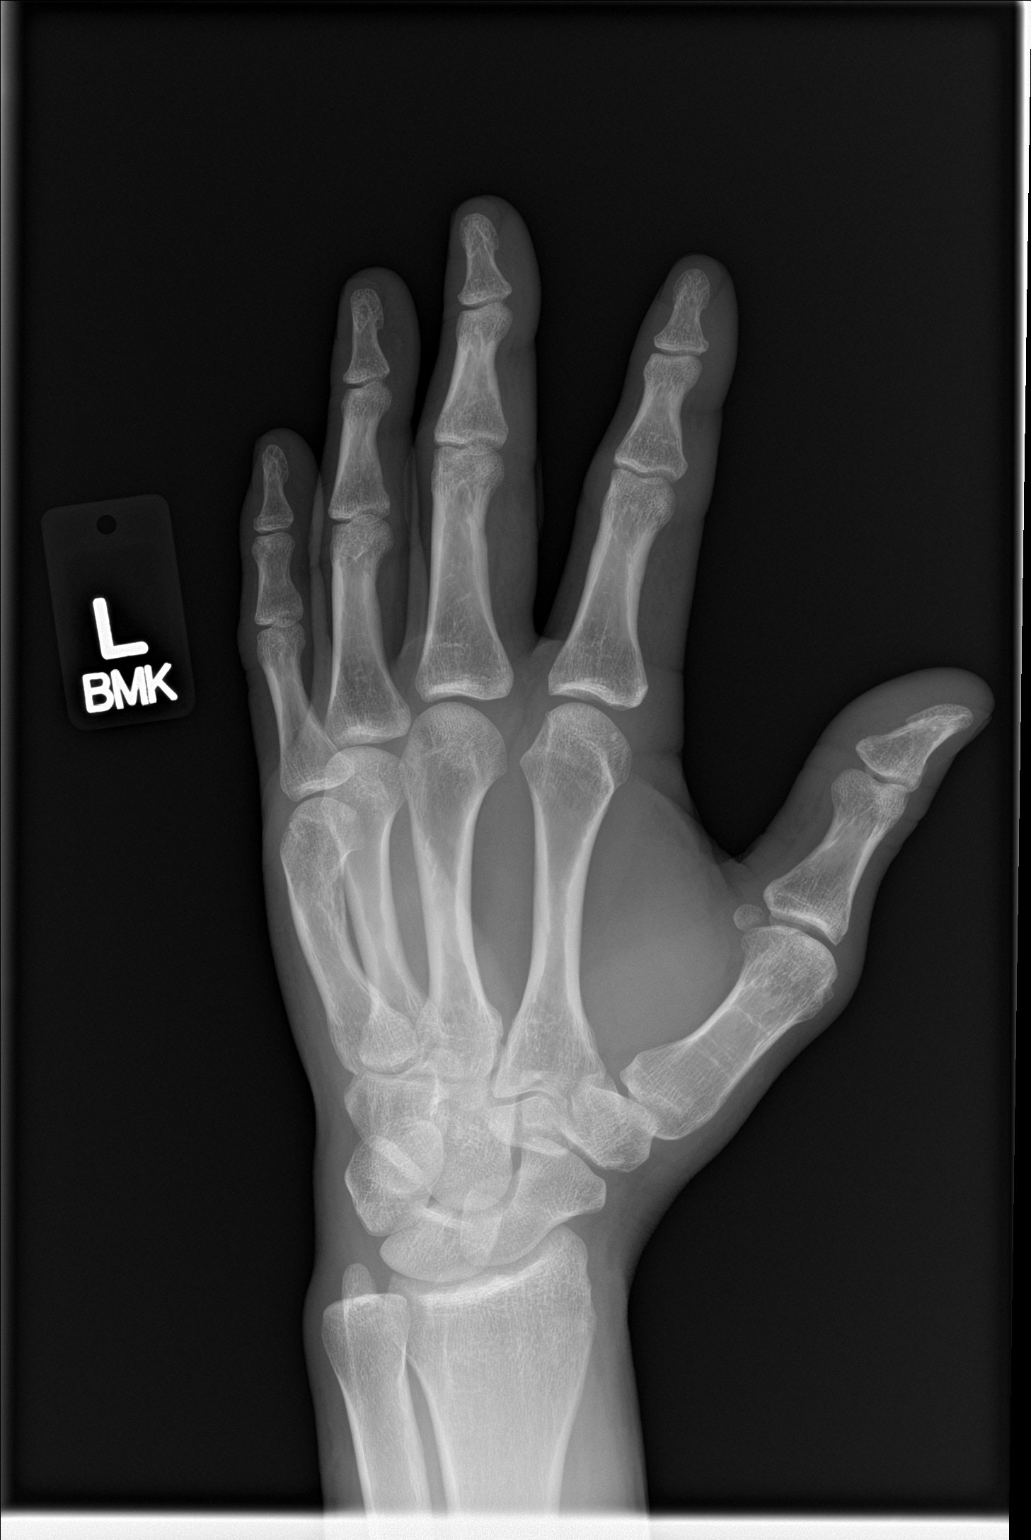

[hand lat]
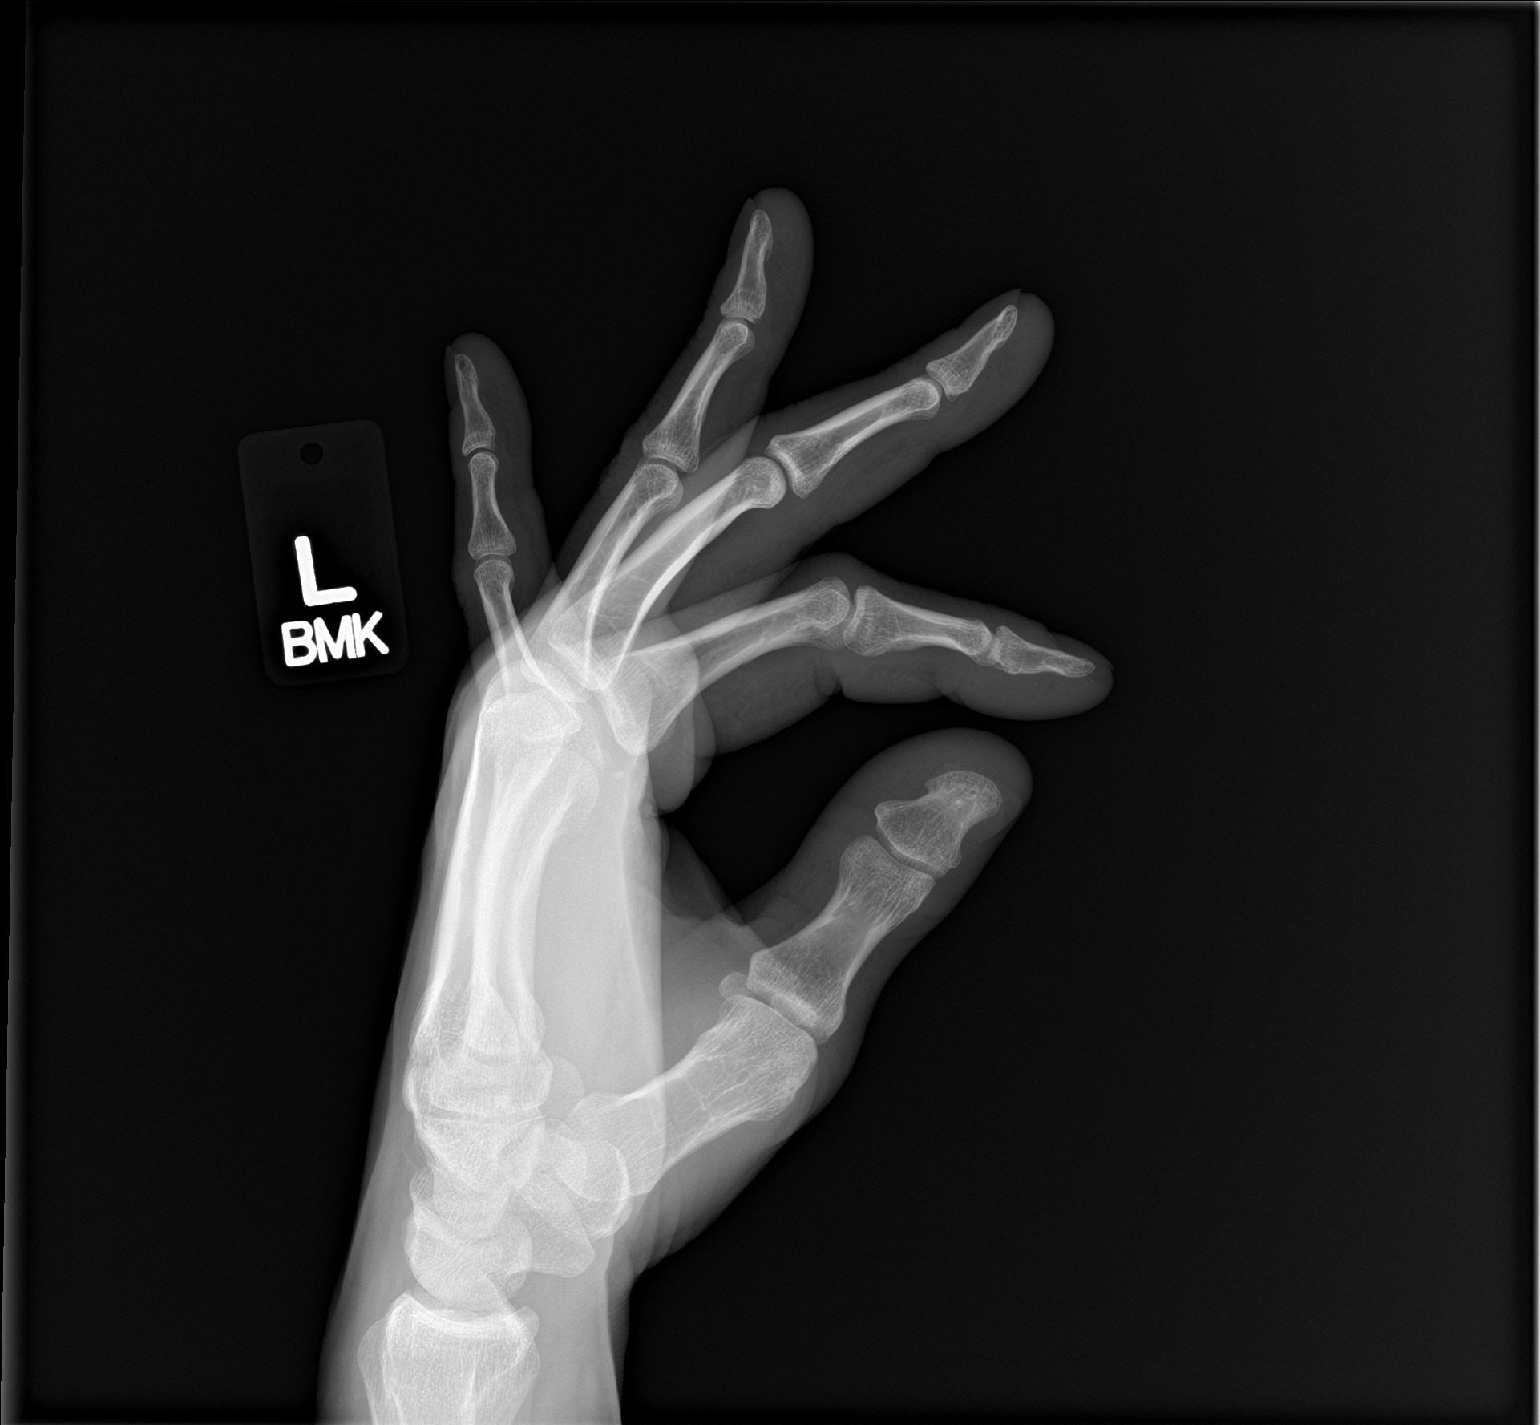

[3 of 3 positions shown; findings below may reference images not displayed]

FINDINGS: There is no evidence of fracture or dislocation. There is no
evidence of arthropathy or other focal bone abnormality. A
superficial soft tissue defect is seen adjacent to the distal aspect
of the proximal phalanx of the third left finger.
IMPRESSION: Soft tissue defect involving the third left finger, without evidence
of acute osseous abnormality.

## 2024-03-01 ENCOUNTER — Encounter: Payer: Self-pay | Admitting: Intensive Care

## 2024-03-01 ENCOUNTER — Emergency Department
Admission: EM | Admit: 2024-03-01 | Discharge: 2024-03-01 | Disposition: A | Discharge status: Home / Self Care | Source: Other Acute Inpatient Hospital | Attending: Emergency Medicine | Admitting: Emergency Medicine

## 2024-03-01 ENCOUNTER — Emergency Department

## 2024-03-01 ENCOUNTER — Other Ambulatory Visit: Payer: Self-pay

## 2024-03-01 DIAGNOSIS — M545 Low back pain, unspecified: Secondary | ICD-10-CM | POA: Diagnosis present

## 2024-03-01 DIAGNOSIS — M5440 Lumbago with sciatica, unspecified side: Secondary | ICD-10-CM | POA: Insufficient documentation

## 2024-03-01 DIAGNOSIS — M5430 Sciatica, unspecified side: Secondary | ICD-10-CM

## 2024-03-01 HISTORY — DX: Sciatica, unspecified side: M54.30

## 2024-03-01 LAB — CBC WITH DIFFERENTIAL/PLATELET
Abs Immature Granulocytes: 0.03 K/uL (ref 0.00–0.07)
Basophils Absolute: 0.1 K/uL (ref 0.0–0.1)
Basophils Relative: 1 %
Eosinophils Absolute: 0.3 K/uL (ref 0.0–0.5)
Eosinophils Relative: 3 %
HCT: 47.7 % (ref 39.0–52.0)
Hemoglobin: 16.7 g/dL (ref 13.0–17.0)
Immature Granulocytes: 0 %
Lymphocytes Relative: 25 %
Lymphs Abs: 2.5 K/uL (ref 0.7–4.0)
MCH: 32.7 pg (ref 26.0–34.0)
MCHC: 35 g/dL (ref 30.0–36.0)
MCV: 93.3 fL (ref 80.0–100.0)
Monocytes Absolute: 0.8 K/uL (ref 0.1–1.0)
Monocytes Relative: 8 %
Neutro Abs: 6.3 K/uL (ref 1.7–7.7)
Neutrophils Relative %: 63 %
Platelets: 261 K/uL (ref 150–400)
RBC: 5.11 MIL/uL (ref 4.22–5.81)
RDW: 11.9 % (ref 11.5–15.5)
WBC: 10 K/uL (ref 4.0–10.5)
nRBC: 0 % (ref 0.0–0.2)

## 2024-03-01 LAB — COMPREHENSIVE METABOLIC PANEL WITH GFR
ALT: 66 U/L — ABNORMAL HIGH (ref 0–44)
AST: 57 U/L — ABNORMAL HIGH (ref 15–41)
Albumin: 4.1 g/dL (ref 3.5–5.0)
Alkaline Phosphatase: 89 U/L (ref 38–126)
Anion gap: 14 (ref 5–15)
BUN: 7 mg/dL (ref 6–20)
CO2: 19 mmol/L — ABNORMAL LOW (ref 22–32)
Calcium: 8.9 mg/dL (ref 8.9–10.3)
Chloride: 102 mmol/L (ref 98–111)
Creatinine, Ser: 0.86 mg/dL (ref 0.61–1.24)
GFR, Estimated: >60 mL/min (ref >60)
Glucose, Bld: 114 mg/dL — ABNORMAL HIGH (ref 70–99)
Potassium: 3.8 mmol/L (ref 3.5–5.1)
Sodium: 135 mmol/L (ref 135–145)
Total Bilirubin: 0.9 mg/dL (ref 0.0–1.2)
Total Protein: 7.6 g/dL (ref 6.5–8.1)

## 2024-03-01 LAB — URINALYSIS, ROUTINE W REFLEX MICROSCOPIC
Bilirubin Urine: NEGATIVE
Glucose, UA: NEGATIVE mg/dL
Hgb urine dipstick: NEGATIVE
Ketones, ur: NEGATIVE mg/dL
Leukocytes,Ua: NEGATIVE
Nitrite: NEGATIVE
Protein, ur: NEGATIVE mg/dL
Specific Gravity, Urine: 1.006 (ref 1.005–1.030)
pH: 5 (ref 5.0–8.0)

## 2024-03-01 MED ORDER — OXYCODONE-ACETAMINOPHEN 5-325 MG PO TABS
1.0000 | ORAL_TABLET | Freq: Once | ORAL | Status: AC
  Administered 2024-03-01: 1 via ORAL
  Filled 2024-03-01: qty 1

## 2024-03-01 MED ORDER — TRAMADOL HCL 50 MG PO TABS: 50.0000 mg | ORAL_TABLET | Freq: Four times a day (QID) | ORAL | 0 refills | Status: AC | PRN

## 2024-03-01 MED ORDER — BACLOFEN 10 MG PO TABS: 10.0000 mg | ORAL_TABLET | Freq: Three times a day (TID) | ORAL | 0 refills | Status: DC

## 2024-03-01 MED ORDER — METHYLPREDNISOLONE 4 MG PO TBPK: ORAL_TABLET | ORAL | 0 refills | Status: AC

## 2024-03-01 MED ORDER — CYCLOBENZAPRINE HCL 10 MG PO TABS
10.0000 mg | ORAL_TABLET | Freq: Once | ORAL | Status: AC
  Administered 2024-03-01: 10 mg via ORAL
  Filled 2024-03-01: qty 1

## 2024-03-01 NOTE — ED Triage Notes (Signed)
 Patient presents with severe lower back pain. Reports sneezing last night and feeling a pop toward middle of back and then pain began. Denies recent injury.  Reports loss of control of bladder due to pain  Has strenuous job.    Reports history of left sciatica pain

## 2024-03-01 NOTE — Discharge Instructions (Addendum)
 Follow-up with your regular doctor as needed.  Return if worsening.  Take medication as prescribed Apply ice to your lower back Follow-up with Dr. lorelle, if they find something on MRI I would recommend Dr. Reeves Nine for back surgery or injection

## 2024-03-01 NOTE — ED Provider Notes (Signed)
 [facility] Provider Note    Event Date/Time   First MD Initiated Contact with Patient 03/01/24 1457     (approximate)   History   Back Pain   HPI  [place] R Pianka is a 38 y.o. male with history of sciatica presents emergency department with severe low back pain.  Patient states been ongoing for several weeks.  States last night he felt a pop in the middle of his back causing more pain into the leg.  States that he is having trouble walking due to the pain.  States he did realize he was urinated when he lost control the bladder was basically he cannot make it to the bathroom on time.  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        Physical Exam   Triage Vital Signs: ED Triage Vitals  Encounter Vitals Group     BP 03/01/24 1234 119/89     Girls Systolic BP Percentile --      Girls Diastolic BP Percentile --      Boys Systolic BP Percentile --      Boys Diastolic BP Percentile --      Pulse Rate 03/01/24 1234 80     Resp 03/01/24 1234 16     Temp 03/01/24 1234 98.7 F (37.1 C)     Temp Source 03/01/24 1234 Oral     SpO2 03/01/24 1234 98 %     Weight 03/01/24 1235 170 lb (77.1 kg)     Height 03/01/24 1235 6' (1.829 m)     Head Circumference --      Peak Flow --      Pain Score 03/01/24 1235 6     Pain Loc --      Pain Education --      Exclude from Growth Chart --     Most recent vital signs: Vitals:   03/01/24 1234  BP: 119/89  Pulse: 80  Resp: 16  Temp: 98.7 F (37.1 C)  SpO2: 98%     General: Awake, no distress.   CV:  Good peripheral perfusion Resp:  Normal effort.  Abd:  No distention.   Other:  Lumbar spine tender, tender more to the left musculature of the lumbar spine, patient is able to stand on toes and heels, is slow to rise out of the chair secondary discomfort, is able to bear weight and walk.  No foot drop noted.  5 or 5 strength lower extremities.   ED Results / Procedures / Treatments    Labs (all labs ordered are listed, but only abnormal results are displayed) Labs Reviewed  COMPREHENSIVE METABOLIC PANEL WITH GFR - Abnormal; Notable for the following components:      Result Value   CO2 19 (*)    Glucose, Bld 114 (*)    AST 57 (*)    ALT 66 (*)    All other components within normal limits  URINALYSIS, ROUTINE W REFLEX MICROSCOPIC - Abnormal; Notable for the following components:   Color, Urine YELLOW (*)    APPearance CLEAR (*)    All other components within normal limits  CBC WITH DIFFERENTIAL/PLATELET     EKG     RADIOLOGY X-ray lumbar spine    PROCEDURES:   Procedures  Critical Care:  no Chief Complaint  Patient presents with   Back Pain      MEDICATIONS ORDERED IN ED: Medications  oxyCODONE -acetaminophen  (PERCOCET/ROXICET) 5-325 MG per tablet 1 tablet (  1 tablet Oral Given 03/01/24 1526)  cyclobenzaprine  (FLEXERIL ) tablet 10 mg (10 mg Oral Given 03/01/24 1529)     IMPRESSION / MDM / ASSESSMENT AND PLAN / ED COURSE  I reviewed the triage vital signs and the nursing notes.                              Differential diagnosis includes, but is not limited to, sciatica, muscle spasm, lumbar radiculopathy, bulging disc  Patient's presentation is most consistent with acute illness / injury with system symptoms.    Labs ordered from triage are reassuring  X-ray lumbar spine ordered  Patient was given Percocet and Flexeril  p.o. while here in the ED  X-ray lumbar spine independently reviewed interpreted by me as being negative for any acute abnormality  I did explain findings to patient.  Will place on steroid pack baclofen , tramadol .  He is to follow-up with orthopedics.  She was given a list of back exercises.  He needs to strengthen the core.  Does work for himself did not offer a work note then.  He is in agreement this treatment plan.  Discharged stable condition.     FINAL CLINICAL IMPRESSION(S) / ED DIAGNOSES   Final  diagnoses:  Acute sciatica     Rx / DC Orders   ED Discharge Orders          Ordered    methylPREDNISolone  (MEDROL  DOSEPAK) 4 MG TBPK tablet        03/01/24 1558    baclofen  (LIORESAL ) 10 MG tablet  3 times daily        03/01/24 1558    traMADol  (ULTRAM ) 50 MG tablet  Every 6 hours PRN        03/01/24 1558             Note:  This document was prepared using Dragon voice recognition software and may include unintentional dictation errors.    Gasper Devere ORN, PA-C 03/01/24 HAR[place]    Floy Roberts, MD 03/01/24 [phone]

## 2024-03-06 ENCOUNTER — Telehealth: Admitting: Family Medicine

## 2024-03-06 DIAGNOSIS — M5441 Lumbago with sciatica, right side: Secondary | ICD-10-CM

## 2024-03-06 NOTE — Progress Notes (Signed)
 I have spent 5 minutes in review of e-visit questionnaire, review and updating patient chart, medical decision making and response to patient.   Elsie Velma Lunger, PA-C

## 2024-03-06 NOTE — Progress Notes (Signed)
 E-Visit for Back Pain   We are sorry that you are not feeling well.  Here is how we plan to help!  Based on what you have shared with me it looks like you mostly have acute back pain.  Acute back pain is defined as musculoskeletal pain that can resolve in 1-3 weeks with conservative treatment.  I have prescribed Naprosyn  500 mg take one by mouth twice a day non-steroid anti-inflammatory (NSAID) as well as Flexeril  10 mg every eight hours as needed which is a muscle relaxer. Stop the Baclofen  and start the Flexeril  as directed.  Some patients experience stomach irritation or in increased heartburn with anti-inflammatory drugs.  Please keep in mind that muscle relaxer's can cause fatigue and should not be taken while at work or driving.  Back pain is very common.  The pain often gets better over time.  The cause of back pain is usually not dangerous.  Most people can learn to manage their back pain on their own.  Home Care Stay active.  Start with short walks on flat ground if you can.  Try to walk farther each day. Do not sit, drive or stand in one place for more than 30 minutes.  Do not stay in bed. Do not avoid exercise or work.  Activity can help your back heal faster. Be careful when you bend or lift an object.  Bend at your knees, keep the object close to you, and do not twist. Sleep on a firm mattress.  Lie on your side, and bend your knees.  If you lie on your back, put a pillow under your knees. Only take medicines as told by your doctor. Put ice on the injured area. Put ice in a plastic bag Place a towel between your skin and the bag Leave the ice on for 15-20 minutes, 3-4 times a day for the first 2-3 days. 210 After that, you can switch between ice and heat packs. Ask your doctor about back exercises or massage. Avoid feeling anxious or stressed.  Find good ways to deal with stress, such as exercise.  Get Help Right Way If: Your pain does not go away with rest or medicine. Your  pain does not go away in 1 week. You have new problems. You do not feel well. The pain spreads into your legs. You cannot control when you poop (bowel movement) or pee (urinate) You feel sick to your stomach (nauseous) or throw up (vomit) You have belly (abdominal) pain. You feel like you may pass out (faint). If you develop a fever.  Make Sure you: Understand these instructions. Will watch your condition Will get help right away if you are not doing well or get worse.  Your e-visit answers were reviewed by a board certified advanced clinical practitioner to complete your personal care plan.  Depending on the condition, your plan could have included both over the counter or prescription medications.  If there is a problem please reply  once you have received a response from your provider.  Your safety is important to us .  If you have drug allergies check your prescription carefully.    You can use MyChart to ask questions about today's visit, request a non-urgent call back, or ask for a work or school excuse for 24 hours related to this e-Visit. If it has been greater than 24 hours you will need to follow up with your provider, or enter a new e-Visit to address those concerns.  You will get  an e-mail in the next two days asking about your experience.  I hope that your e-visit has been valuable and will speed your recovery. Thank you for using e-visits.

## 2024-03-07 MED ORDER — NAPROXEN 500 MG PO TABS: 500.0000 mg | ORAL_TABLET | Freq: Two times a day (BID) | ORAL | 0 refills | Status: AC

## 2024-03-07 MED ORDER — CYCLOBENZAPRINE HCL 10 MG PO TABS: 5.0000 mg | ORAL_TABLET | Freq: Three times a day (TID) | ORAL | 0 refills | Status: AC | PRN

## 2024-03-07 NOTE — Addendum Note (Signed)
 Addended by: VIVIENNE DELON HERO on: 03/07/2024 11:34 AM   Modules accepted: Orders
